# Patient Record
Sex: Male | Born: 2000 | Race: Black or African American | Hispanic: No | Marital: Single | Smoking: Never smoker
Health system: Southern US, Community
[De-identification: ages and names within clinical notes are randomized; demographics above are authoritative.]

## PROBLEM LIST (undated history)

## (undated) DIAGNOSIS — J45909 Unspecified asthma, uncomplicated: Secondary | ICD-10-CM

## (undated) DIAGNOSIS — J309 Allergic rhinitis, unspecified: Secondary | ICD-10-CM

---
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
## 2018-01-01 ENCOUNTER — Encounter: Payer: Self-pay | Admitting: Emergency Medicine

## 2018-01-01 ENCOUNTER — Other Ambulatory Visit: Payer: Self-pay

## 2018-01-01 ENCOUNTER — Ambulatory Visit
Admission: EM | Admit: 2018-01-01 | Discharge: 2018-01-01 | Disposition: A | Discharge status: Home / Self Care | Payer: Medicaid Other | Attending: Emergency Medicine | Admitting: Emergency Medicine

## 2018-01-01 DIAGNOSIS — M542 Cervicalgia: Secondary | ICD-10-CM | POA: Diagnosis present

## 2018-01-01 DIAGNOSIS — R509 Fever, unspecified: Secondary | ICD-10-CM | POA: Diagnosis not present

## 2018-01-01 DIAGNOSIS — J111 Influenza due to unidentified influenza virus with other respiratory manifestations: Secondary | ICD-10-CM

## 2018-01-01 DIAGNOSIS — R519 Headache, unspecified: Secondary | ICD-10-CM

## 2018-01-01 DIAGNOSIS — R51 Headache: Secondary | ICD-10-CM | POA: Diagnosis not present

## 2018-01-01 DIAGNOSIS — R42 Dizziness and giddiness: Secondary | ICD-10-CM | POA: Diagnosis not present

## 2018-01-01 LAB — RAPID INFLUENZA A&B ANTIGENS (ARMC ONLY)
INFLUENZA A (ARMC): NEGATIVE
INFLUENZA B (ARMC): NEGATIVE

## 2018-01-01 MED ORDER — IBUPROFEN 600 MG PO TABS: 600.0000 mg | ORAL_TABLET | Freq: Four times a day (QID) | ORAL | 0 refills | Status: DC | PRN

## 2018-01-01 MED ORDER — OSELTAMIVIR PHOSPHATE 75 MG PO CAPS: 75.0000 mg | ORAL_CAPSULE | Freq: Two times a day (BID) | ORAL | 0 refills | Status: DC

## 2018-01-01 MED ORDER — IBUPROFEN 800 MG PO TABS
800.0000 mg | ORAL_TABLET | Freq: Once | ORAL | Status: AC
  Administered 2018-01-01: 800 mg via ORAL

## 2018-01-01 NOTE — ED Provider Notes (Signed)
HPI  SUBJECTIVE:  Tyler Davila is a 17 y.o. male who reports gradual onset throbbing achy dull constant frontal headache starting last night.  He reports chills.  He reports mild neck pain/soreness as if he "slept on it wrong".  He reports mild photosensitivity.  He reports lightheadedness/dizziness intermittent, lasting minutes, present with large positional changes only.  He denies vertigo, palpitations, chest pain, syncope or presyncope.  He has not had any fevers at home, no nasal congestion, rhinorrhea, postnasal drip, sinus pain or pressure.  No visual loss, blurry vision, double vision, ear pain, dental pain.  No sore throat.  No facial droop, arm or leg weakness, dysarthria, aphasia.  No rash.  No body aches.  No coughing, wheezing, chest pain, shortness of breath.  He has had headaches like this before.  This is not the first or worst headache ever.  He tried ibuprofen 600 mg, his last dose was yesterday.  Symptoms are worse with going from lying down to sitting up.  It is not affected with moving his head.  He denies phonophobia.  He also reports right midline crampy intermittent nonmigratory, nonradiating minutes long abdominal pain starting around this time.  He reports anorexia.  He tried Tums with improvement of symptoms.  Symptoms are worse with eating.  He denies abdominal distention, back pain, nausea, vomiting.  It is not associated with walking, movement or urination, defecation.  No diarrhea.  Had normal bowel movement today.  States that the car ride over here was not painful.  He denies urinary complaints, testicular or penile pain, rash, discharge or swelling.  He did not get a flu shot this year.  No contacts with flu.  He has a past medical history of asthma, seasonal allergies.  No history of diabetes, hypertension, UTI.  No history of gallbladder disease, pancreatitis, appendicitis, abdominal surgeries.  No past medical history of migraines, sinusitis.  All  immunizations are up-to-date.  PMD: [facility].   Past Medical History:  Diagnosis Date  . Allergic rhinitis   . Asthma     Past Surgical History:  Procedure Laterality Date  . ADENOIDECTOMY  2009    Family History  Problem Relation Age of Onset  . Arrhythmia Mother   . Hypertension Father   . Diabetes Father     Social History   Tobacco Use  . Smoking status: Never Smoker  . Smokeless tobacco: Never Used  Substance Use Topics  . Alcohol use: No    Alcohol/week: 0.0 oz  . Drug use: No    No current facility-administered medications for this encounter.   Current Outpatient Medications:  .  albuterol (PROVENTIL) (2.5 MG/3ML) 0.083% nebulizer solution, Take 3 mLs (2.5 mg total) by nebulization every 6 (six) hours as needed for wheezing or shortness of breath., Disp: 75 mL, Rfl: 0 .  beclomethasone (QVAR) 80 MCG/ACT inhaler, Inhale 2 puffs into the lungs 2 (two) times daily., Disp: , Rfl:  .  loratadine (CLARITIN) 10 MG tablet, Take 10 mg by mouth daily., Disp: , Rfl:  .  ibuprofen (ADVIL,MOTRIN) 600 MG tablet, Take 1 tablet (600 mg total) by mouth every 6 (six) hours as needed., Disp: 30 tablet, Rfl: 0 .  oseltamivir (TAMIFLU) 75 MG capsule, Take 1 capsule (75 mg total) by mouth 2 (two) times daily. X 5 days, Disp: 10 capsule, Rfl: 0 .  triamcinolone (KENALOG) 0.025 % cream, Apply 1 application topically 2 (two) times daily., Disp: 30 g, Rfl: 0  No Known  Allergies   ROS  As noted in HPI.   Physical Exam  BP 139/60 (BP Location: Left Arm)   Pulse 100   Temp 100.3 F (37.9 C) (Oral)   Resp 16   Ht 6\' 2"  (1.88 m)   Wt 295 lb (133.8 kg)   SpO2 98%   BMI 37.88 kg/m   Constitutional: Well developed, well nourished, no acute distress.  Mildly photophobic. Eyes: PERRL, EOMI, conjunctiva normal bilaterally.  HENT: Normocephalic, atraumatic,mucus membranes moist.  TM normal b/l. No TMJ tenderness. Normal dentition. No nasal congestion, no sinus tenderness.  No temporal artery tenderness.  Oropharynx normal. Neck: no cervical LN. no trapezial muscle tenderness. No meningismus Respiratory: normal inspiratory effort, lungs clear bilaterally Cardiovascular: Mild regular tachycardia, no murmurs, rubs, gallops GI:  nondistended soft, nontender.  Negative Murphy, negative McBurney.  Active bowel sounds.   Back: No CVA tenderness skin: No rash, skin intact Musculoskeletal: No edema, no tenderness, no deformities Neurologic: Alert & oriented x 3, CN II-XII intact, romberg neg, finger-> nose, heel-> shin equal b/l, Romberg neg, tandem gait steady.  Gait steady. Psychiatric: Speech and behavior appropriate   ED Course  Medications  ibuprofen (ADVIL,MOTRIN) tablet 800 mg (800 mg Oral Given 01/01/18 1840)    Orders Placed This Encounter  Procedures  . Rapid Influenza A&B Antigens ([facility] only)    Standing Status:   Standing    Number of Occurrences:   1  . Recheck vitals    30 min after meds    Standing Status:   Standing    Number of Occurrences:   1  . Droplet precaution    Standing Status:   Standing    Number of Occurrences:   1   Results for orders placed or performed during the hospital encounter of 01/01/18 (from the past 24 hour(s))  Rapid Influenza A&B Antigens ([facility] only)     Status: None   Collection Time: 01/01/18  6:37 PM  Result Value Ref Range   Influenza A ([facility]) NEGATIVE NEGATIVE   Influenza B ([facility]) NEGATIVE NEGATIVE   No results found.   ED Clinical Impression  Influenza  Acute nonintractable headache, unspecified headache type  ED Assessment/Plan   Doubt SAH, ICH or space occupying lesion.  While patient has fevers 101.1 intially and chills, Pt has no meningeal sx, no nuchal rigidity. Doubt meningitis. Pt with normal neuro exam, no evidence of CVA/TIA.  Pt BP not elevated significantly, doubt hypertensive emergency. No evidence of temporal artery tenderness, no evidence of glaucoma or other ocular pathology.  Giving  ibuprofen 800 mg and will reassess.  On reassessment, patient states that his headache has almost completely disappeared.  Rates the pain as a 2.  He is moving around the department comfortably.  Fever trending down prior to discharge.  Pt with continued non-focal neuro exam.  His abdomen is benign.  Doubt intra-abdominal process causing his symptoms.  No evidence of surgical abdomen.  Rapid flu negative, however, presentation is suggestive of an influenza-like illness.  Plan to send home with ibuprofen 600 mg to take with 1 g of Tylenol, push fluids, Tamiflu.  Patient will go to the ER if his headache is not better in 12-24 hours, if he gets worse in the interim, or for fevers not controlled with Tylenol/ibuprofen.    Discussed  MDM, plan for follow up, gave strict ER return precautions. Pt and parent agrees with plan  Meds ordered this encounter  Medications  . ibuprofen (ADVIL,MOTRIN) tablet 800 mg  .  oseltamivir (TAMIFLU) 75 MG capsule    Sig: Take 1 capsule (75 mg total) by mouth 2 (two) times daily. X 5 days    Dispense:  10 capsule    Refill:  0  . ibuprofen (ADVIL,MOTRIN) 600 MG tablet    Sig: Take 1 tablet (600 mg total) by mouth every 6 (six) hours as needed.    Dispense:  30 tablet    Refill:  0    *This clinic note was created using Scientist, clinical (histocompatibility and immunogenetics)Dragon dictation software. Therefore, there may be occasional mistakes despite careful proofreading.  ?   Domenick GongMortenson, Makiah Foye, MD 01/01/18 2012

## 2018-01-01 NOTE — Discharge Instructions (Signed)
Take 600 mg of ibuprofen with 1 g of Tylenol, which is 2 extra strength Tylenol,  3-4 times a day.  Push electrolyte containing fluids.  Finish the Tamiflu unless your doctor tells you to stop.  Go immediately to the ER if your headache is not better in 12-24 hours, if your headache gets worse before then, or for fevers not controlled with Tylenol and ibuprofen.

## 2018-01-01 NOTE — ED Triage Notes (Signed)
Patient c/o stomach pain, HAs, pain and stiffness in his neck and dizziness that started yesterday. Patient denies nausea and vomiting.

## 2018-08-10 IMAGING — CR DG CHEST 2V
2 series · 2 of 2 positions shown · non-contrast
Comparison: 12/14/2014

CLINICAL DATA: Pleuritic chest pain for 2 weeks.  Asthma.

EXAM:
CHEST  2 VIEW

[chest pa]
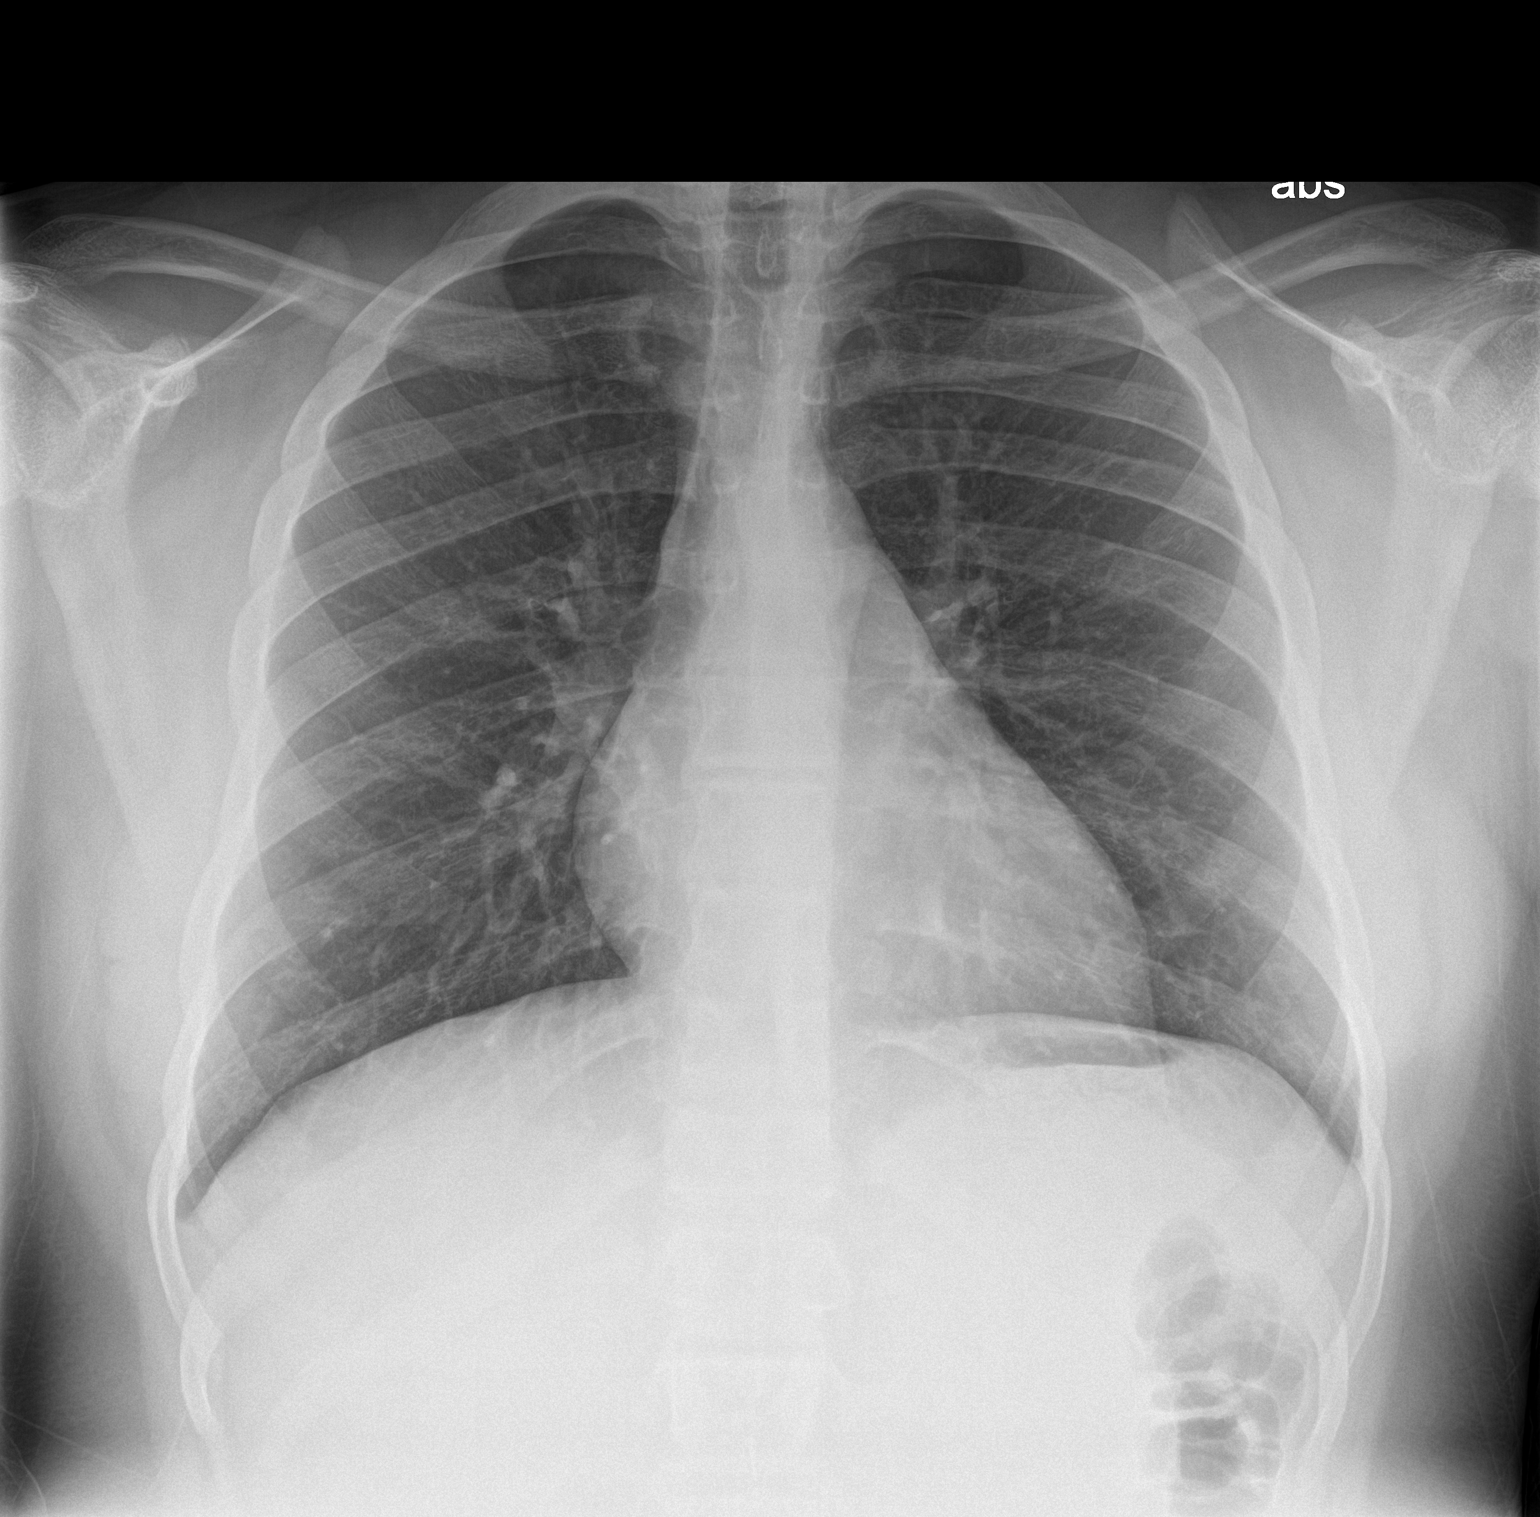

[chest lat]
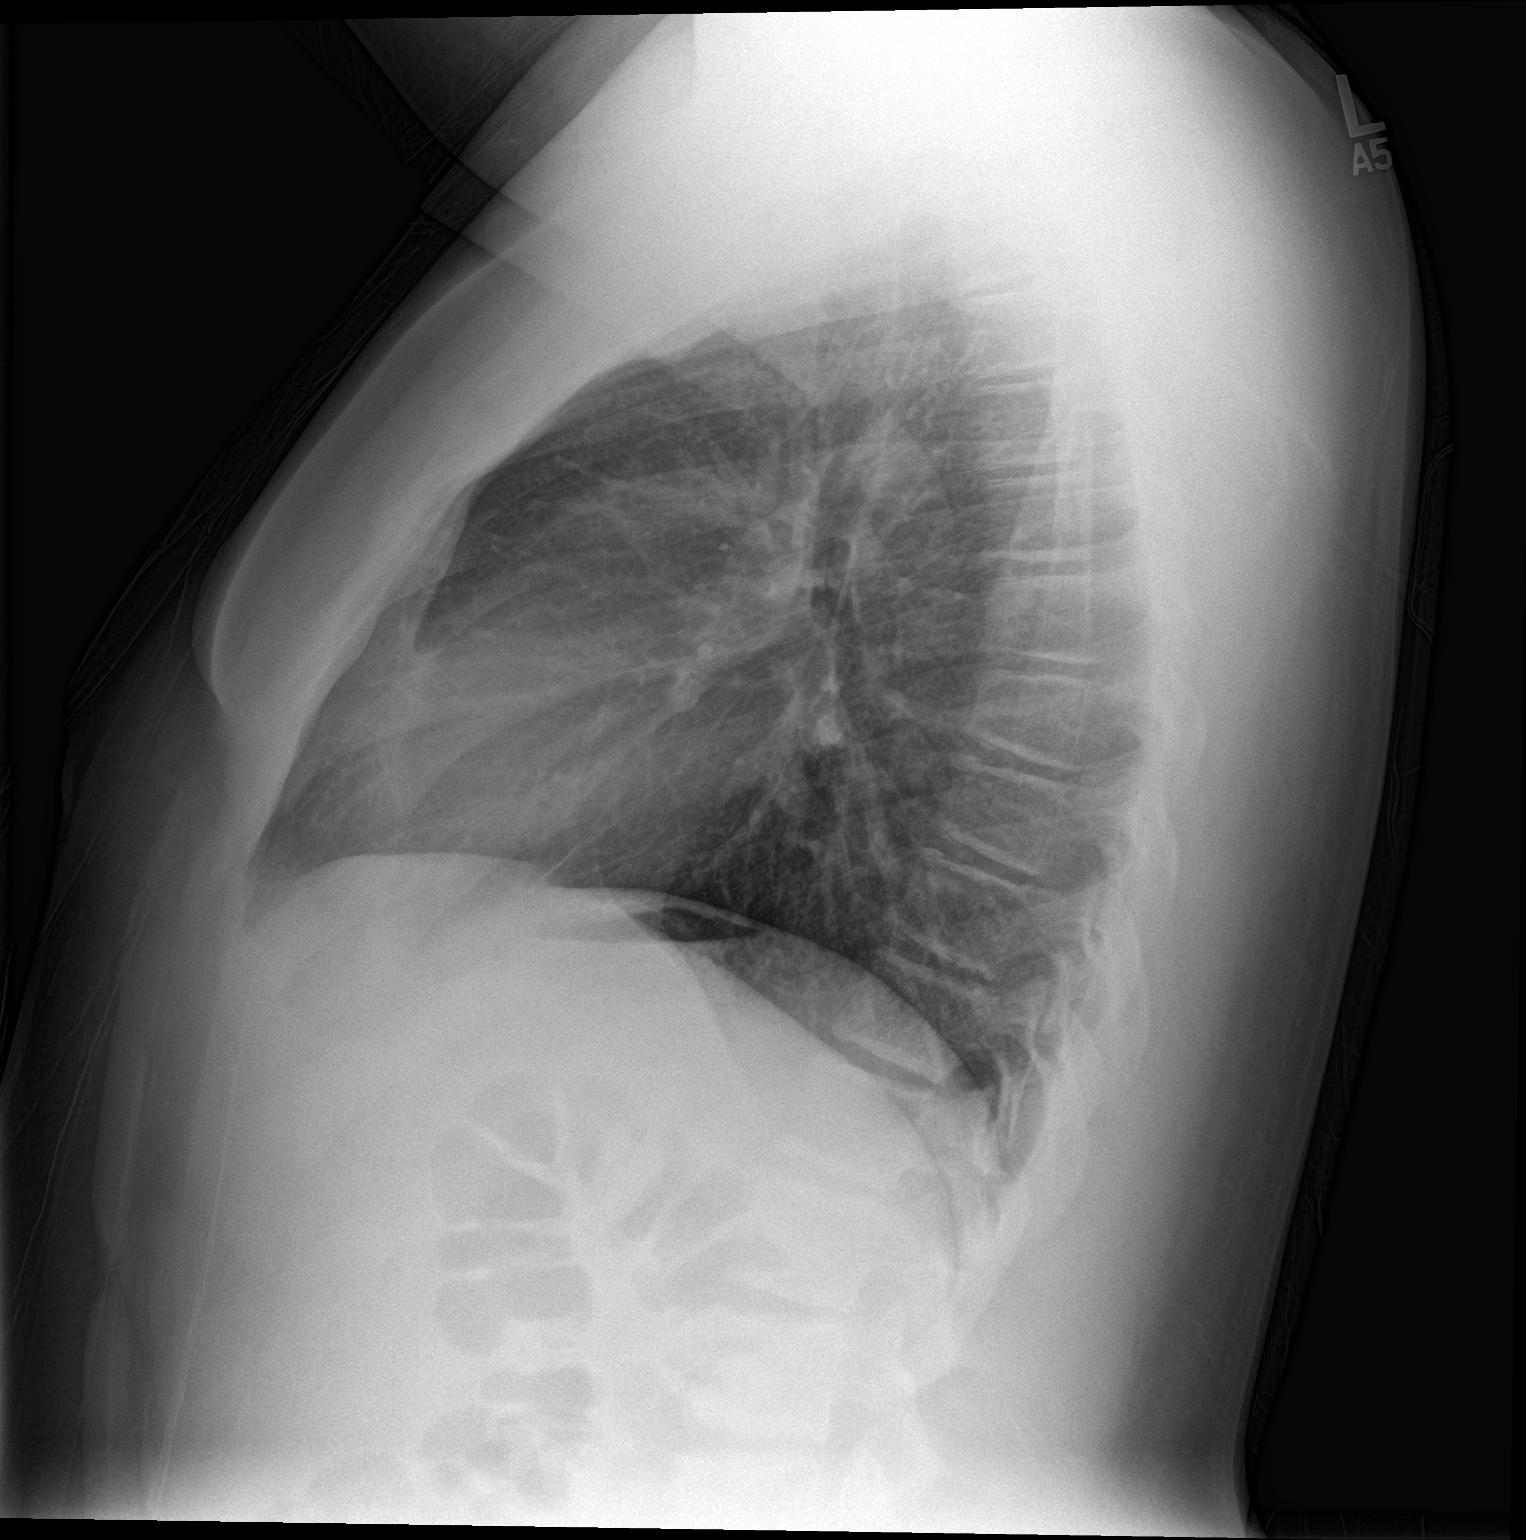

[2 of 2 positions shown; findings below may reference images not displayed]

FINDINGS: The heart size and mediastinal contours are within normal limits.
Both lungs are clear. No evidence of pneumothorax or pleural
effusion. The visualized skeletal structures are unremarkable.
IMPRESSION: Negative.  No active cardiopulmonary disease.

## 2019-08-06 ENCOUNTER — Other Ambulatory Visit: Payer: Self-pay

## 2019-08-06 DIAGNOSIS — Z20822 Contact with and (suspected) exposure to covid-19: Secondary | ICD-10-CM

## 2019-08-08 LAB — NOVEL CORONAVIRUS, NAA: SARS-CoV-2, NAA: NOT DETECTED

## 2019-08-17 DIAGNOSIS — Z03818 Encounter for observation for suspected exposure to other biological agents ruled out: Secondary | ICD-10-CM | POA: Diagnosis not present

## 2019-09-20 DIAGNOSIS — R0981 Nasal congestion: Secondary | ICD-10-CM | POA: Diagnosis not present

## 2019-09-20 DIAGNOSIS — J45909 Unspecified asthma, uncomplicated: Secondary | ICD-10-CM | POA: Diagnosis not present

## 2019-09-20 DIAGNOSIS — R05 Cough: Secondary | ICD-10-CM | POA: Diagnosis not present

## 2019-09-20 DIAGNOSIS — Z03818 Encounter for observation for suspected exposure to other biological agents ruled out: Secondary | ICD-10-CM | POA: Diagnosis not present

## 2019-09-20 DIAGNOSIS — Z7951 Long term (current) use of inhaled steroids: Secondary | ICD-10-CM | POA: Diagnosis not present

## 2019-09-20 DIAGNOSIS — J069 Acute upper respiratory infection, unspecified: Secondary | ICD-10-CM | POA: Diagnosis not present

## 2019-09-20 DIAGNOSIS — Z20828 Contact with and (suspected) exposure to other viral communicable diseases: Secondary | ICD-10-CM | POA: Diagnosis not present

## 2019-10-29 DIAGNOSIS — Z03818 Encounter for observation for suspected exposure to other biological agents ruled out: Secondary | ICD-10-CM | POA: Diagnosis not present

## 2020-05-13 ENCOUNTER — Other Ambulatory Visit: Payer: Self-pay

## 2020-05-13 ENCOUNTER — Emergency Department
Admission: EM | Admit: 2020-05-13 | Discharge: 2020-05-13 | Disposition: A | Discharge status: Home / Self Care | Payer: Federal, State, Local not specified - PPO | Attending: Emergency Medicine | Admitting: Emergency Medicine

## 2020-05-13 ENCOUNTER — Emergency Department: Payer: Federal, State, Local not specified - PPO

## 2020-05-13 ENCOUNTER — Encounter: Payer: Self-pay | Admitting: Physician Assistant

## 2020-05-13 DIAGNOSIS — J4521 Mild intermittent asthma with (acute) exacerbation: Secondary | ICD-10-CM | POA: Diagnosis not present

## 2020-05-13 DIAGNOSIS — R0602 Shortness of breath: Secondary | ICD-10-CM

## 2020-05-13 MED ORDER — FLUTICASONE PROPIONATE 50 MCG/ACT NA SUSP: 2.0000 | Freq: Every day | NASAL | 0 refills | Status: AC

## 2020-05-13 MED ORDER — BECLOMETHASONE DIPROPIONATE 80 MCG/ACT IN AERS: 2.0000 | INHALATION_SPRAY | Freq: Two times a day (BID) | RESPIRATORY_TRACT | 1 refills | Status: DC

## 2020-05-13 MED ORDER — BECLOMETHASONE DIPROPIONATE 80 MCG/ACT IN AERS: 2.0000 | INHALATION_SPRAY | Freq: Two times a day (BID) | RESPIRATORY_TRACT | 1 refills | Status: AC

## 2020-05-13 MED ORDER — PREDNISONE 10 MG (21) PO TBPK: ORAL_TABLET | ORAL | 0 refills | Status: AC

## 2020-05-13 NOTE — ED Triage Notes (Signed)
Pt presents to ED via POV with c/o asthma exascerbation. Pt states "I can take a deep breath but it doesn't feel like I can take a deep breath". Pt ambulatory without difficulty at this time, able to speak in full and complete sentences at this time.

## 2020-05-13 NOTE — ED Notes (Addendum)
See triage note- pt reports unable to take deep breath, feels like he feels short of breath when he tries to inhale deeply. Pt states hx of the same when he was a child and prednisone shot releived symptoms. Occasional cough.  Pt talking in complete sentences with no SHOB at this time.

## 2020-05-13 NOTE — ED Provider Notes (Signed)
[facility] Emergency Department Provider Note ____________________________________________  Time seen: 1857  I have reviewed the triage vital signs and the nursing notes.  HISTORY  Chief Complaint  Asthma  HPI Tyler Davila is a 19 y.o. male presents himself to the ED for evaluation of several weeks of intermittent persistent shortness of breath.  Patient describes a sensation that he is not able to get a full breath when he breathes.  He denies any fever, chills, sweats, chest pain he also denies any significant cough, wheeze, or anxiety.  Patient with a history of mild persistent asthma also reports he has been out of his Qvar for several months.  He does use his albuterol inhaler and neb, and takes allergy medicine as needed.  He reports receiving 2 doses Covid vaccine, completed about 4 weeks prior.  He denies any recent concerns for high risk exposure.   Past Medical History:  Diagnosis Date  . Allergic rhinitis   . Asthma     There are no problems to display for this patient.   Past Surgical History:  Procedure Laterality Date  . ADENOIDECTOMY  2009    Prior to Admission medications   Medication Sig Start Date End Date Taking? Authorizing Provider  albuterol (PROVENTIL) (2.5 MG/3ML) 0.083% nebulizer solution Take 3 mLs (2.5 mg total) by nebulization every 6 (six) hours as needed for wheezing or shortness of breath. 12/17/17   Payton Mccallum, MD  beclomethasone (QVAR) 80 MCG/ACT inhaler Inhale 2 puffs into the lungs 2 (two) times daily. 05/13/20   Cidney Kirkwood, Charlesetta Ivory, PA-C  fluticasone (FLONASE) 50 MCG/ACT nasal spray Place 2 sprays into both nostrils daily. 05/13/20   Emmagene Ortner, Charlesetta Ivory, PA-C  loratadine (CLARITIN) 10 MG tablet Take 10 mg by mouth daily.    [provider]  predniSONE (STERAPRED UNI-PAK 21 TAB) 10 MG (21) TBPK tablet 6-day taper as directed. 05/13/20   Clovia Reine, Charlesetta Ivory, PA-C    Allergies Patient has no  known allergies.  Family History  Problem Relation Age of Onset  . Arrhythmia Mother   . Hypertension Father   . Diabetes Father     Social History Social History   Tobacco Use  . Smoking status: Never Smoker  . Smokeless tobacco: Never Used  Substance Use Topics  . Alcohol use: No    Alcohol/week: 0.0 standard drinks  . Drug use: No    Review of Systems  Constitutional: Negative for fever. Eyes: Negative for visual changes. ENT: Negative for sore throat. Cardiovascular: Negative for chest pain. Respiratory: Positive for shortness of breath. Gastrointestinal: Negative for abdominal pain, vomiting and diarrhea. Genitourinary: Negative for dysuria. Musculoskeletal: Negative for back pain. Skin: Negative for rash. Neurological: Negative for headaches, focal weakness or numbness. ____________________________________________  PHYSICAL EXAM:  VITAL SIGNS: ED Triage Vitals  Enc Vitals Group     BP 05/13/20 1816 135/69     Pulse Rate 05/13/20 1816 92     Resp 05/13/20 1816 20     Temp 05/13/20 1816 98.2 F (36.8 C)     Temp src --      SpO2 05/13/20 1816 100 %     Weight 05/13/20 1817 (!) 305 lb (138.3 kg)     Height 05/13/20 1817 6\' 2"  (1.88 m)     Head Circumference --      Peak Flow --      Pain Score 05/13/20 1811 0     Pain Loc --  Pain Edu? --      Excl. in [place]? --     Constitutional: Alert and oriented. Well appearing and in no distress. Head: Normocephalic and atraumatic. Eyes: Conjunctivae are normal. PERRL. Normal extraocular movements Ears: Canals clear. TMs intact bilaterally. Nose: No congestion/rhinorrhea/epistaxis. Mouth/Throat: Mucous membranes are moist. Cardiovascular: Normal rate, regular rhythm. Normal distal pulses. Respiratory: Normal respiratory effort. No wheezes/rales/rhonchi. Gastrointestinal: Soft and nontender. No distention. Musculoskeletal: Nontender with normal range of motion in all extremities.  Neurologic:  Normal gait  without ataxia. Normal speech and language. No gross focal neurologic deficits are appreciated. Skin:  Skin is warm, dry and intact. No rash noted. ____________________________________________   RADIOLOGY  CXR  Negative ____________________________________________  PROCEDURES  Procedures ____________________________________________  INITIAL IMPRESSION / ASSESSMENT AND PLAN / ED COURSE  Patient presents to the ED for evaluation of intermittent shortness of breath.  Patient clinical picture is overall benign reassuring at this time.  No signs of acute respiratory distress or sepsis.  His x-ray is negative for any acute intrathoracic process.  Patient Tyler be treated for any acute asthma exacerbation with a steroid taper pack his Qvar is also refilled at this time.  He Tyler follow-up with his primary provider return to the ED as needed.  Tyler Davila was evaluated in Emergency Department on 05/13/2020 for the symptoms described in the history of present illness. He was evaluated in the context of the global COVID-19 pandemic, which necessitated consideration that the patient might be at risk for infection with the SARS-CoV-2 virus that causes COVID-19. Institutional protocols and algorithms that pertain to the evaluation of patients at risk for COVID-19 are in a state of rapid change based on information released by regulatory bodies including the CDC and federal and state organizations. These policies and algorithms were followed during the patient's care in the ED. ____________________________________________  FINAL CLINICAL IMPRESSION(S) / ED DIAGNOSES  Final diagnoses:  Shortness of breath  Mild intermittent asthma with exacerbation      Carmie End, Dannielle Karvonen, PA-C 05/13/20 2018    Nena Polio, MD 05/14/20 [phone]

## 2020-05-13 NOTE — Discharge Instructions (Signed)
Your exam and CXR are normal at this time. Take these prescription meds as directed. Follow-up with your provider or return as needed.

## 2020-12-11 ENCOUNTER — Encounter: Payer: Self-pay | Admitting: *Deleted

## 2020-12-11 ENCOUNTER — Emergency Department
Admission: EM | Admit: 2020-12-11 | Discharge: 2020-12-12 | Disposition: A | Discharge status: Home / Self Care | Payer: Federal, State, Local not specified - PPO | Attending: Emergency Medicine | Admitting: Emergency Medicine

## 2020-12-11 ENCOUNTER — Other Ambulatory Visit: Payer: Self-pay

## 2020-12-11 DIAGNOSIS — J45909 Unspecified asthma, uncomplicated: Secondary | ICD-10-CM | POA: Insufficient documentation

## 2020-12-11 DIAGNOSIS — U071 COVID-19: Secondary | ICD-10-CM | POA: Insufficient documentation

## 2020-12-11 DIAGNOSIS — R059 Cough, unspecified: Secondary | ICD-10-CM | POA: Diagnosis present

## 2020-12-11 DIAGNOSIS — R55 Syncope and collapse: Secondary | ICD-10-CM | POA: Insufficient documentation

## 2020-12-11 LAB — URINALYSIS, COMPLETE (UACMP) WITH MICROSCOPIC
Bacteria, UA: NONE SEEN
Bilirubin Urine: NEGATIVE
Glucose, UA: NEGATIVE mg/dL
Hgb urine dipstick: NEGATIVE
Ketones, ur: NEGATIVE mg/dL
Leukocytes,Ua: NEGATIVE
Nitrite: NEGATIVE
Protein, ur: NEGATIVE mg/dL
Specific Gravity, Urine: 1.012 (ref 1.005–1.030)
Squamous Epithelial / LPF: NONE SEEN (ref 0–5)
WBC, UA: NONE SEEN WBC/hpf (ref 0–5)
pH: 7 (ref 5.0–8.0)

## 2020-12-11 LAB — CBC
HCT: 43.5 % (ref 39.0–52.0)
Hemoglobin: 14.1 g/dL (ref 13.0–17.0)
MCH: 27.2 pg (ref 26.0–34.0)
MCHC: 32.4 g/dL (ref 30.0–36.0)
MCV: 84 fL (ref 80.0–100.0)
Platelets: 318 10*3/uL (ref 150–400)
RBC: 5.18 MIL/uL (ref 4.22–5.81)
RDW: 12.8 % (ref 11.5–15.5)
WBC: 7.6 10*3/uL (ref 4.0–10.5)
nRBC: 0 % (ref 0.0–0.2)

## 2020-12-11 LAB — BASIC METABOLIC PANEL WITH GFR
Anion gap: 9 (ref 5–15)
BUN: 12 mg/dL (ref 6–20)
CO2: 31 mmol/L (ref 22–32)
Calcium: 9.1 mg/dL (ref 8.9–10.3)
Chloride: 101 mmol/L (ref 98–111)
Creatinine, Ser: 1.02 mg/dL (ref 0.61–1.24)
GFR, Estimated: >60 mL/min
Glucose, Bld: 94 mg/dL (ref 70–99)
Potassium: 4 mmol/L (ref 3.5–5.1)
Sodium: 141 mmol/L (ref 135–145)

## 2020-12-11 NOTE — ED Provider Notes (Signed)
[facility] Emergency Department Provider Note  Time seen: 11:59 PM  I have reviewed the triage vital signs and the nursing notes.   HISTORY  Chief Complaint Loss of Consciousness   HPI Tyler Davila is a 20 y.o. male with a past medical history of asthma presents to the emergency department after syncopal episode.  According to the patient for the past 3 to 4 days he has had cough with sinus/nasal congestion.  Patient states 2 days ago he had a fever but has not had one since.  Today he was feeling weak and while standing to urinate he briefly lost consciousness.  Denies hitting his head.  Patient states several members of his household are sick with similar symptoms which they believe are likely Covid.  Patient was vaccinated but received his vaccinations in May.  Patient denies any vomiting or diarrhea.  Largely negative review of systems otherwise.  Currently the patient appears well.   Past Medical History:  Diagnosis Date  . Allergic rhinitis   . Asthma     There are no problems to display for this patient.   Past Surgical History:  Procedure Laterality Date  . ADENOIDECTOMY  2009    Prior to Admission medications   Medication Sig Start Date End Date Taking? Authorizing Provider  albuterol (PROVENTIL) (2.5 MG/3ML) 0.083% nebulizer solution Take 3 mLs (2.5 mg total) by nebulization every 6 (six) hours as needed for wheezing or shortness of breath. 12/17/17   Payton Mccallum, MD  beclomethasone (QVAR) 80 MCG/ACT inhaler Inhale 2 puffs into the lungs 2 (two) times daily. 05/13/20   Menshew, Charlesetta Ivory, PA-C  fluticasone (FLONASE) 50 MCG/ACT nasal spray Place 2 sprays into both nostrils daily. 05/13/20   Menshew, Charlesetta Ivory, PA-C  loratadine (CLARITIN) 10 MG tablet Take 10 mg by mouth daily.    [provider]  predniSONE (STERAPRED UNI-PAK 21 TAB) 10 MG (21) TBPK tablet 6-day taper as directed. 05/13/20   Menshew, Charlesetta Ivory, PA-C     No Known Allergies  Family History  Problem Relation Age of Onset  . Arrhythmia Mother   . Hypertension Father   . Diabetes Father     Social History Social History   Tobacco Use  . Smoking status: Never Smoker  . Smokeless tobacco: Never Used  Vaping Use  . Vaping Use: Never used  Substance Use Topics  . Alcohol use: No    Alcohol/week: 0.0 standard drinks  . Drug use: No    Review of Systems Constitutional: Fever 2 days ago, none since. Cardiovascular: Negative for chest pain. Respiratory: Negative for shortness of breath.  Occasional cough. Gastrointestinal: Negative for abdominal pain, vomiting and diarrhea. Genitourinary: Negative for urinary compaints Musculoskeletal: Negative for musculoskeletal complaints Neurological: Negative for headache All other ROS negative  ____________________________________________   PHYSICAL EXAM:  VITAL SIGNS: ED Triage Vitals  Enc Vitals Group     BP 12/11/20 2243 (!) 141/89     Pulse Rate 12/11/20 2243 68     Resp 12/11/20 2243 16     Temp 12/11/20 2243 98.1 F (36.7 C)     Temp Source 12/11/20 2243 Oral     SpO2 12/11/20 2243 100 %     Weight 12/11/20 2244 300 lb (136.1 kg)     Height 12/11/20 2244 6\' 3"  (1.905 m)     Head Circumference --      Peak Flow --      Pain Score 12/11/20  2243 10     Pain Loc --      Pain Edu? --      Excl. in [place]? --     Constitutional: Alert and oriented. Well appearing and in no distress. Eyes: Normal exam ENT      Head: Normocephalic and atraumatic.      Mouth/Throat: Mucous membranes are moist. Cardiovascular: Normal rate, regular rhythm.  Respiratory: Normal respiratory effort without tachypnea nor retractions. Breath sounds are clear  Gastrointestinal: Soft and nontender. No distention.  Musculoskeletal: Nontender with normal range of motion in all extremities. Neurologic:  Normal speech and language. No gross focal neurologic deficits Skin:  Skin is warm, dry and intact.   Psychiatric: Mood and affect are normal.   ____________________________________________    EKG  EKG viewed and interpreted by myself shows a normal sinus rhythm at 68 bpm with a narrow QRS, normal axis, normal intervals, no concerning ST changes.  ____________________________________________    RADIOLOGY  X-ray is clear  ____________________________________________   INITIAL IMPRESSION / ASSESSMENT AND PLAN / ED COURSE  Pertinent labs & imaging results that were available during my care of the patient were reviewed by me and considered in my medical decision making (see chart for details).   Patient presents to the emergency department for loss of consciousness.  Patient states for the past 3 to 4 days he has been feeling unwell with generalized body aches occasional headache, cough fever 2 days ago.  Several members of his household are sick with similar symptoms.  Patient did receive Covid vaccinations but his last vaccine was in May.  Overall the patient appears very well at this time.  Normal physical exam.  Reassuring vitals.  Reassuring EKG.  Currently satting 96% on room air during my evaluation.  We will obtain a chest x-ray and a Covid swab.  I did discuss IV hydration with the patient, but he would much prefer to orally hydrate.  States he has not been nauseated or vomited, has been drinking Pedialyte at home.  Patient's lab work is reassuring.  Chest x-ray is clear.  Covid is positive.  We will discharge home with supportive care.  Patient agreeable plan of care.  Discussed isolation precautions.  Tyler Davila was evaluated in Emergency Department on 12/11/2020 for the symptoms described in the history of present illness. He was evaluated in the context of the global COVID-19 pandemic, which necessitated consideration that the patient might be at risk for infection with the SARS-CoV-2 virus that causes COVID-19. Institutional protocols and algorithms that pertain  to the evaluation of patients at risk for COVID-19 are in a state of rapid change based on information released by regulatory bodies including the CDC and federal and state organizations. These policies and algorithms were followed during the patient's care in the ED.  ____________________________________________   FINAL CLINICAL IMPRESSION(S) / ED DIAGNOSES  Syncope COVID-19   Minna Antis, MD 12/12/20 [phone]

## 2020-12-11 NOTE — ED Triage Notes (Signed)
Pt to ED after a syncopal episode today. Pt has been having cough, congestion, fever of 103, headache and body aches x 5 days ago. Pt has not tested positive but reports his family has been sick since after getting together for christmas. Pt denies dizziness or lightheadedness at this time.   Pts last dose of tylenol was yesterday, denies having had a fever today when he checked at home.

## 2020-12-11 NOTE — ED Notes (Signed)
Pt reports a syncopal episode around 2100 tonight. Pt states he got dizzy and then fell to ground. Negative LOC. Pt did not hit head. Pt did not lose control of bowel or bladder. Pt states he had "head cold" sxs recently and that entire household had Covid. Pt denies CP and is in no acute respiratory distress at this time. Pt ambulatory and A&O x 4.

## 2020-12-12 ENCOUNTER — Encounter: Payer: Self-pay | Admitting: Radiology

## 2020-12-12 ENCOUNTER — Emergency Department: Payer: Federal, State, Local not specified - PPO

## 2020-12-12 LAB — RESP PANEL BY RT-PCR (FLU A&B, COVID) ARPGX2
Influenza A by PCR: NEGATIVE
Influenza B by PCR: NEGATIVE
SARS Coronavirus 2 by RT PCR: POSITIVE — AB

## 2022-11-26 IMAGING — DX DG CHEST 1V PORT
1 series · 1 of 1 positions shown · non-contrast
Comparison: 05/13/2020

CLINICAL DATA: Cough

EXAM:
PORTABLE CHEST 1 VIEW

[chest ap]
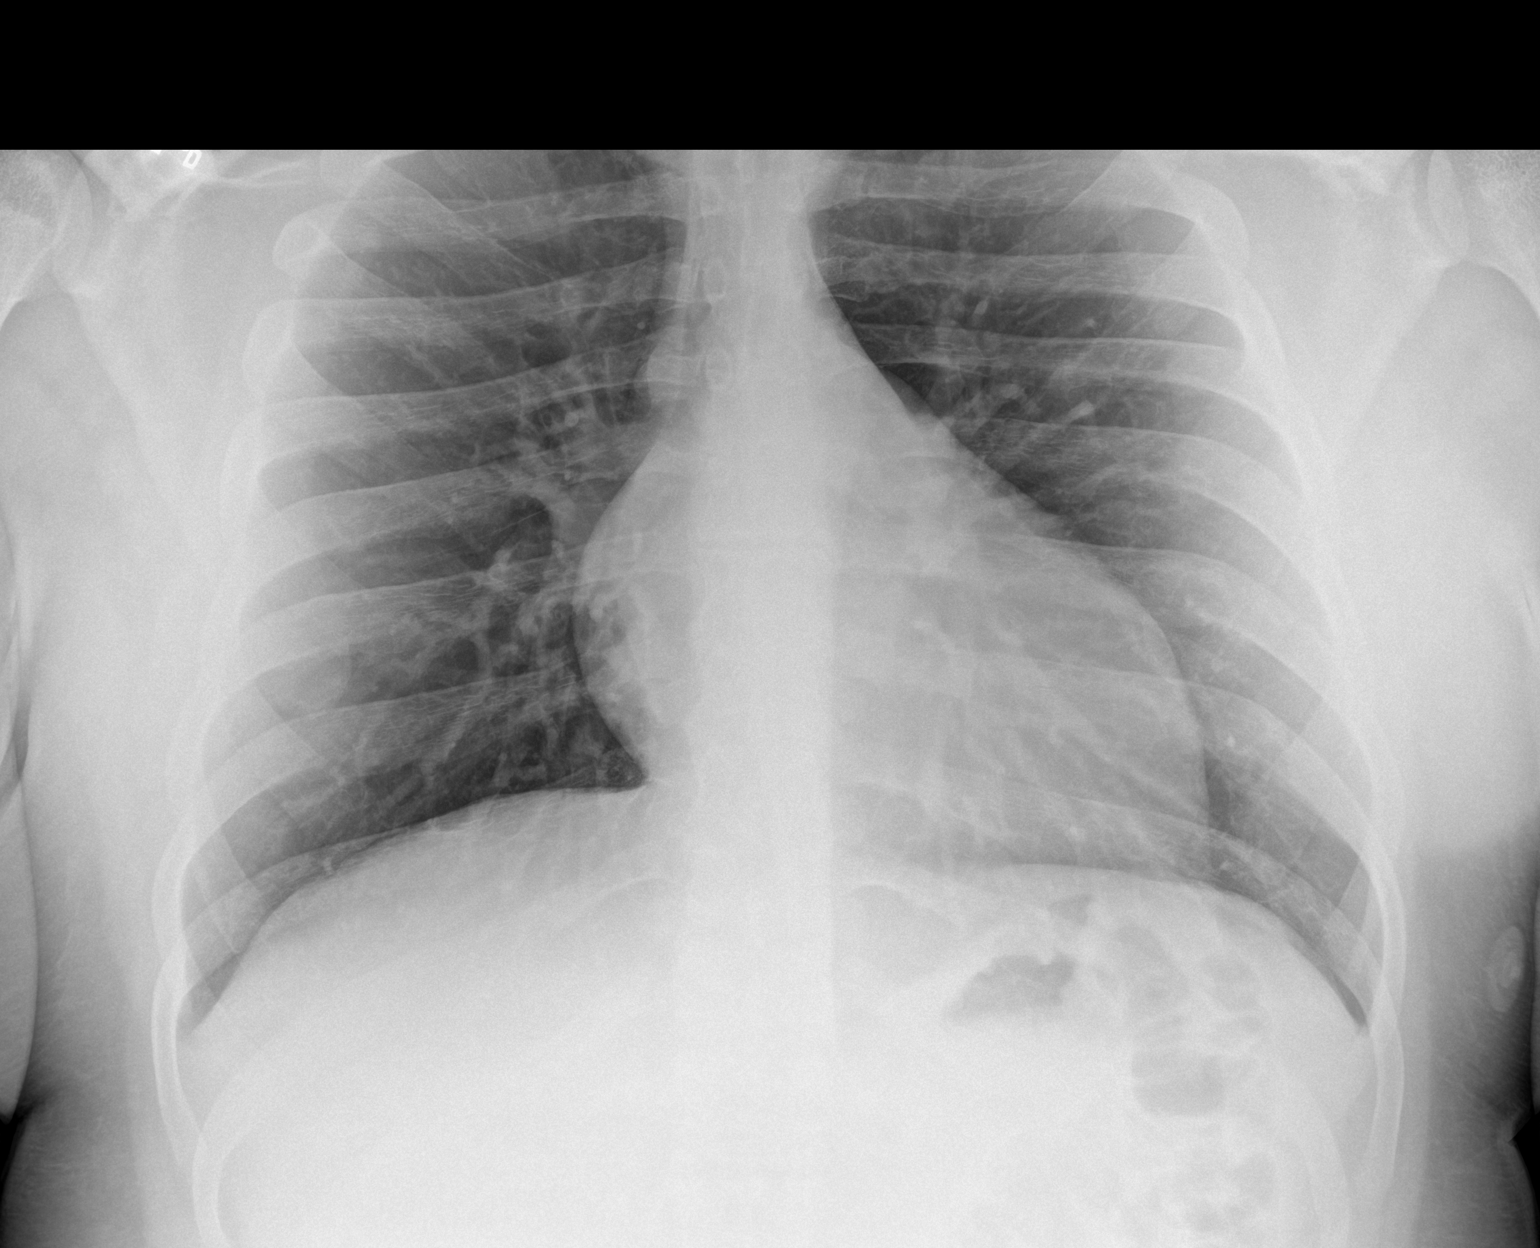

[1 of 1 positions shown; findings below may reference images not displayed]

FINDINGS: The heart size and mediastinal contours are within normal limits.
Both lungs are clear. The visualized skeletal structures are
unremarkable.
IMPRESSION: No active disease.
